# Patient Record
Sex: Female | Born: 1975 | Race: White | Hispanic: No | Marital: Married
Health system: Southern US, Community
[De-identification: ages and names within clinical notes are randomized; demographics above are authoritative.]

---

## 2000-09-10 ENCOUNTER — Encounter: Payer: Self-pay | Admitting: Family Medicine

## 2000-09-10 ENCOUNTER — Ambulatory Visit (HOSPITAL_COMMUNITY): Admission: RE | Admit: 2000-09-10 | Discharge: 2000-09-10 | Payer: Self-pay | Admitting: Family Medicine

## 2000-09-25 ENCOUNTER — Other Ambulatory Visit: Admission: RE | Admit: 2000-09-25 | Discharge: 2000-09-25 | Payer: Self-pay | Admitting: Family Medicine

## 2000-11-13 ENCOUNTER — Ambulatory Visit (HOSPITAL_COMMUNITY): Admission: RE | Admit: 2000-11-13 | Discharge: 2000-11-13 | Payer: Self-pay | Admitting: Family Medicine

## 2000-11-13 ENCOUNTER — Encounter: Payer: Self-pay | Admitting: Family Medicine

## 2001-01-15 ENCOUNTER — Ambulatory Visit (HOSPITAL_COMMUNITY): Admission: RE | Admit: 2001-01-15 | Discharge: 2001-01-15 | Payer: Self-pay | Admitting: Family Medicine

## 2001-01-15 ENCOUNTER — Encounter: Payer: Self-pay | Admitting: Family Medicine

## 2001-02-24 ENCOUNTER — Encounter: Payer: Self-pay | Admitting: Family Medicine

## 2001-02-24 ENCOUNTER — Ambulatory Visit (HOSPITAL_COMMUNITY): Admission: RE | Admit: 2001-02-24 | Discharge: 2001-02-24 | Payer: Self-pay | Admitting: Family Medicine

## 2001-03-21 ENCOUNTER — Ambulatory Visit (HOSPITAL_COMMUNITY): Admission: RE | Admit: 2001-03-21 | Discharge: 2001-03-21 | Payer: Self-pay | Admitting: Family Medicine

## 2001-03-21 ENCOUNTER — Encounter: Payer: Self-pay | Admitting: Family Medicine

## 2001-04-08 ENCOUNTER — Inpatient Hospital Stay (HOSPITAL_COMMUNITY): Admission: AD | Admit: 2001-04-08 | Discharge: 2001-04-10 | Payer: Self-pay | Admitting: Unknown Physician Specialty

## 2003-08-10 ENCOUNTER — Other Ambulatory Visit: Admission: RE | Admit: 2003-08-10 | Discharge: 2003-08-10 | Payer: Self-pay | Admitting: Gynecology

## 2004-12-18 ENCOUNTER — Other Ambulatory Visit: Admission: RE | Admit: 2004-12-18 | Discharge: 2004-12-18 | Payer: Self-pay | Admitting: Gynecology

## 2006-05-28 ENCOUNTER — Other Ambulatory Visit: Admission: RE | Admit: 2006-05-28 | Discharge: 2006-05-28 | Payer: Self-pay | Admitting: Gynecology

## 2006-06-03 ENCOUNTER — Encounter: Admission: RE | Admit: 2006-06-03 | Discharge: 2006-06-03 | Payer: Self-pay | Admitting: Gynecology

## 2007-07-13 ENCOUNTER — Observation Stay (HOSPITAL_COMMUNITY): Admission: EM | Admit: 2007-07-13 | Discharge: 2007-07-15 | Payer: Self-pay | Admitting: Emergency Medicine

## 2007-11-27 ENCOUNTER — Other Ambulatory Visit: Admission: RE | Admit: 2007-11-27 | Discharge: 2007-11-27 | Payer: Self-pay | Admitting: Obstetrics and Gynecology

## 2009-09-29 IMAGING — CR DG CHEST 2V
2 series · 2 of 2 positions shown · non-contrast
Comparison: None.

CLINICAL DATA: 31 year old; fever, cough, and chest pain.
 CHEST - 2 VIEW - 07/13/07:

[w chest pa]
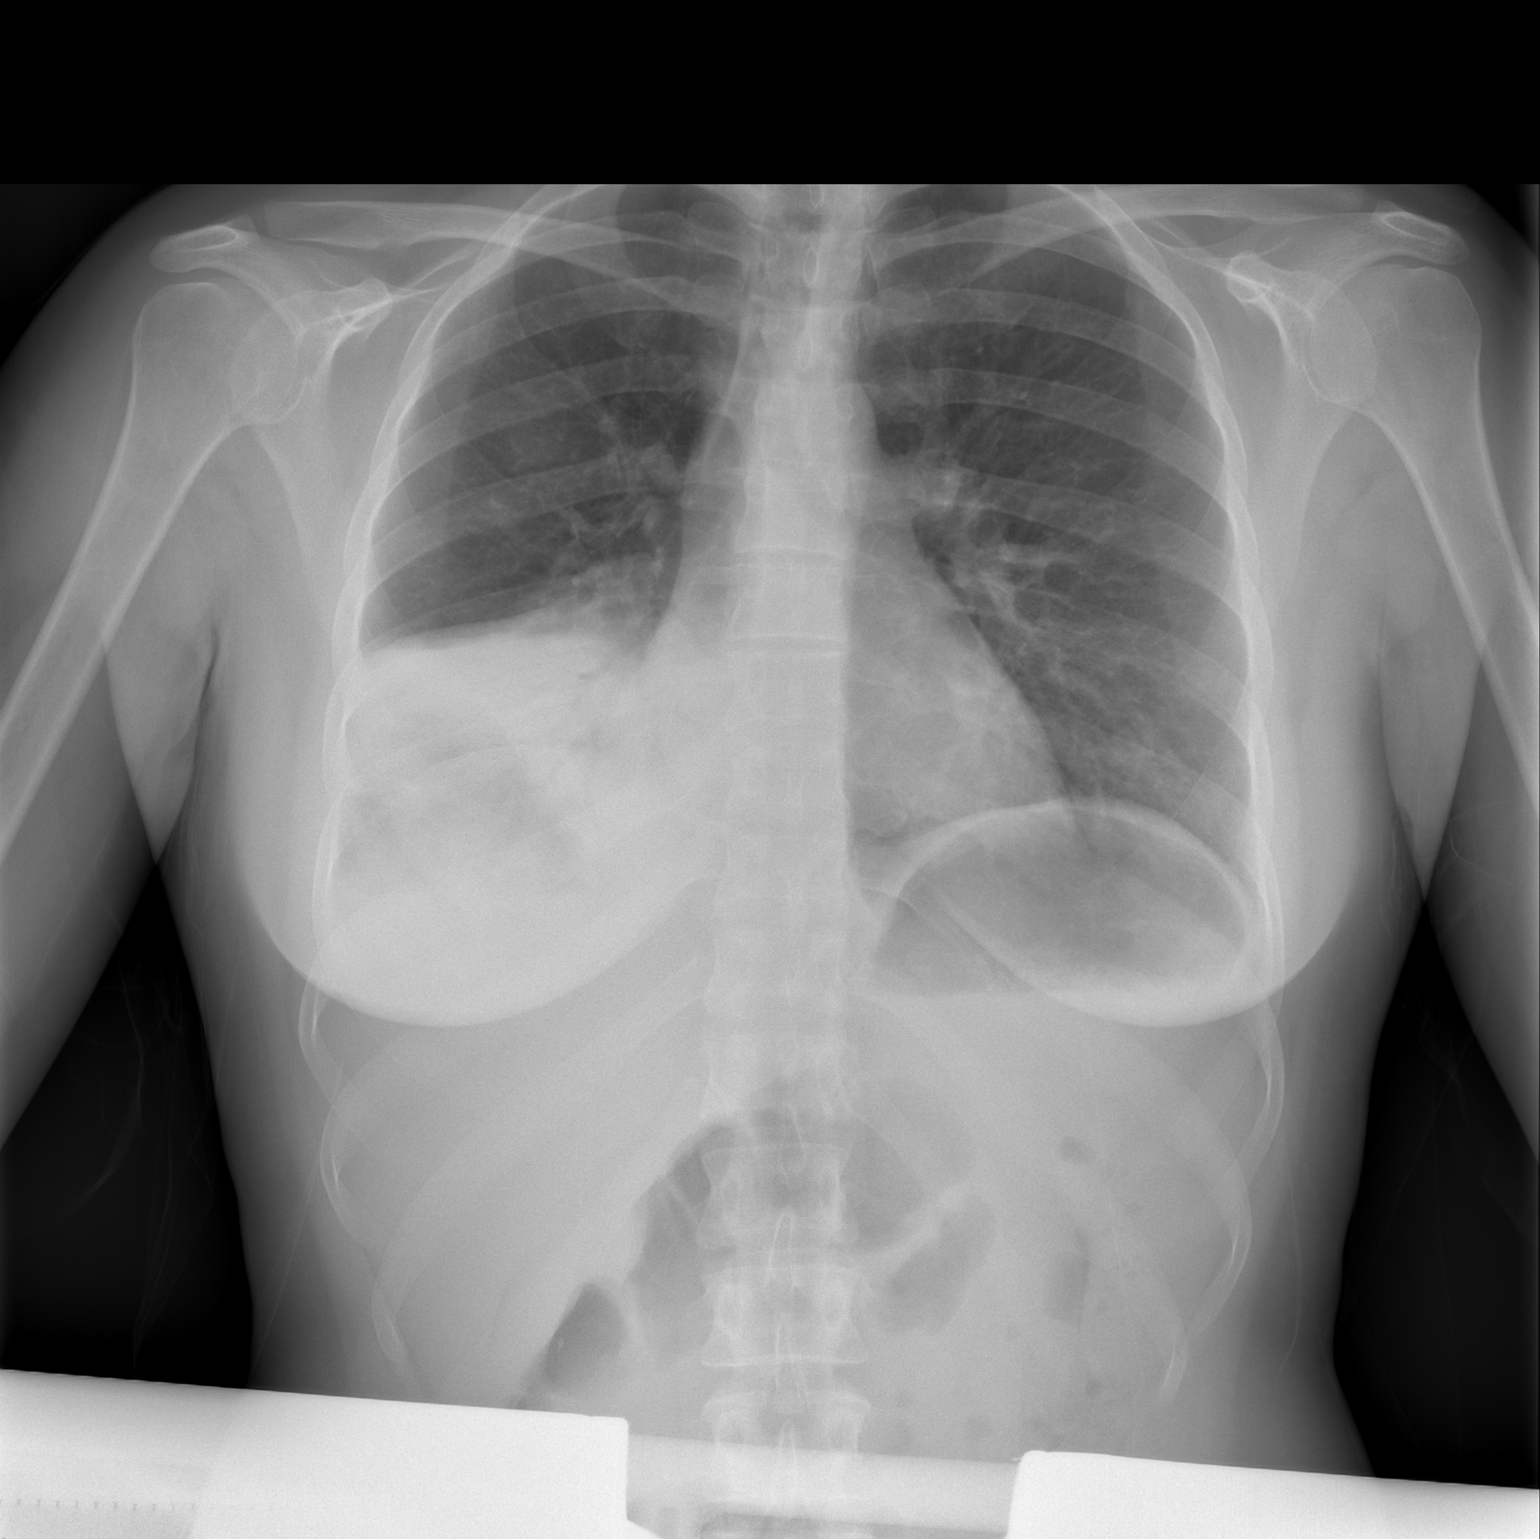

[w chest lat]
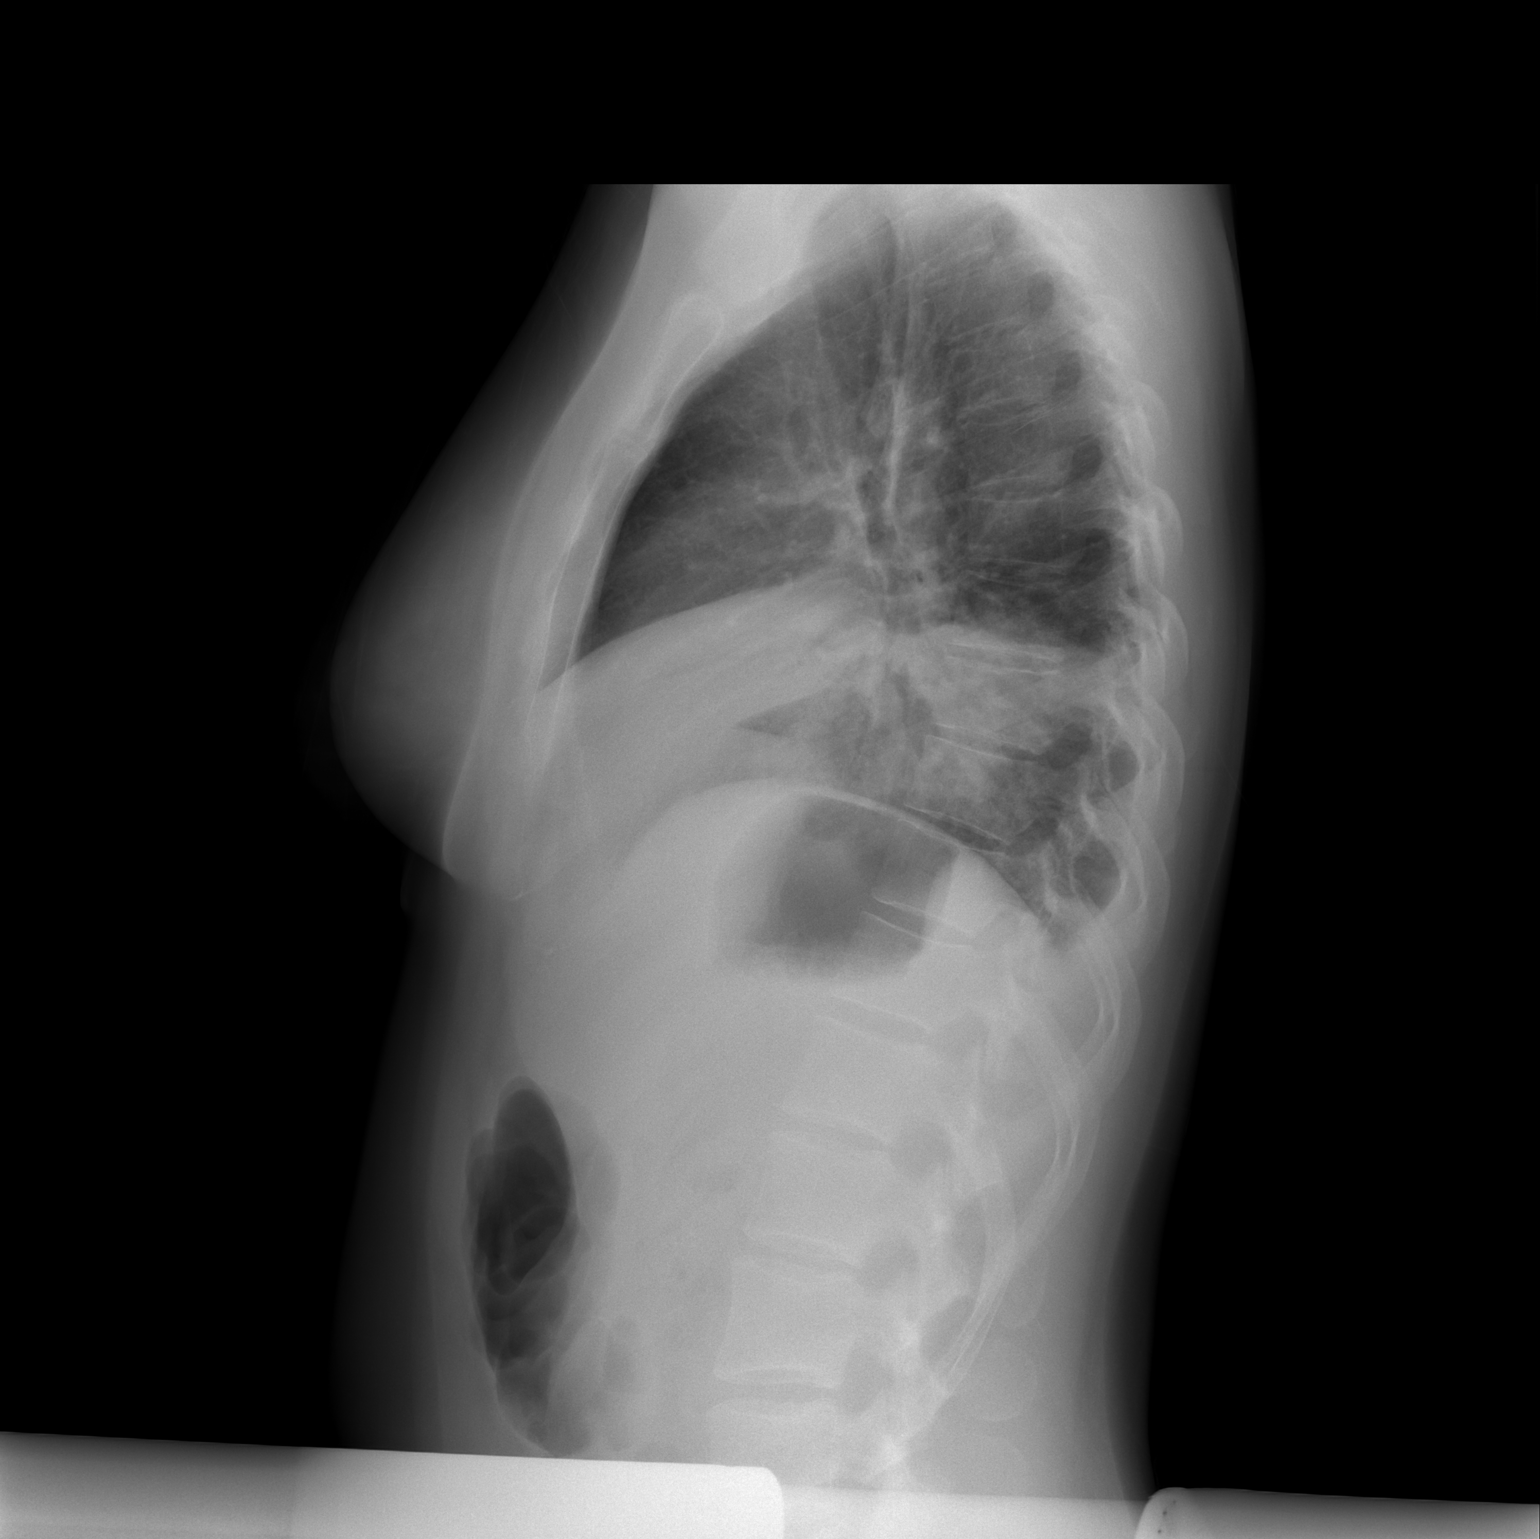

[2 of 2 positions shown; findings below may reference images not displayed]

FINDINGS: The cardiac silhouette, mediastinal and hilar contours are within normal limits.   There are right middle and lower lobe infiltrates. The left lung is clear.  The bony thorax is intact.
IMPRESSION: Right middle lobe and right lower lobe pneumonias.

## 2009-10-01 IMAGING — CR DG CHEST 2V
2 series · 2 of 2 positions shown · non-contrast
Comparison: 07/13/07.

CLINICAL DATA: Pneumonia.  Follow-up exam.
 CHEST - 2 VIEW:

[w chest pa]
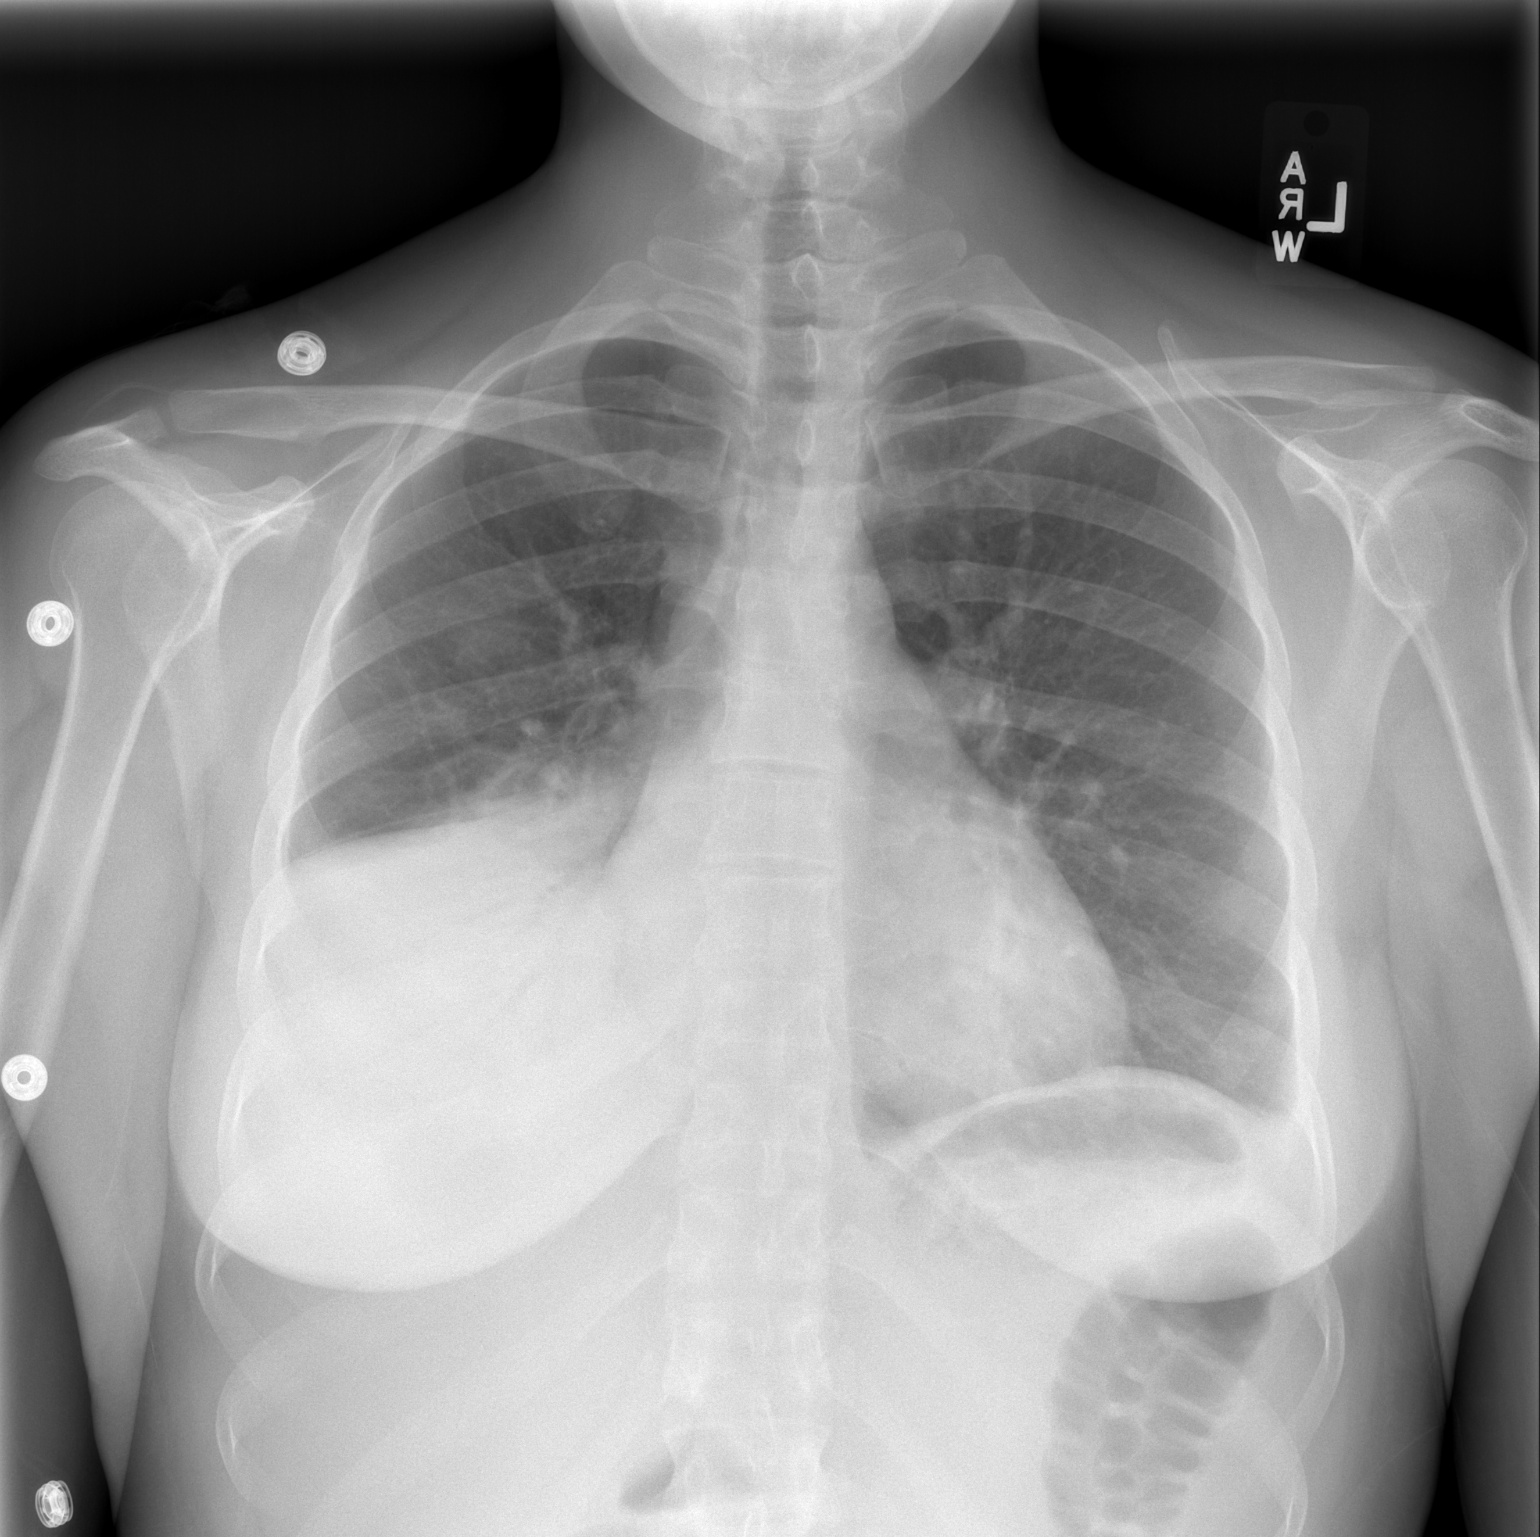

[w chest lat]
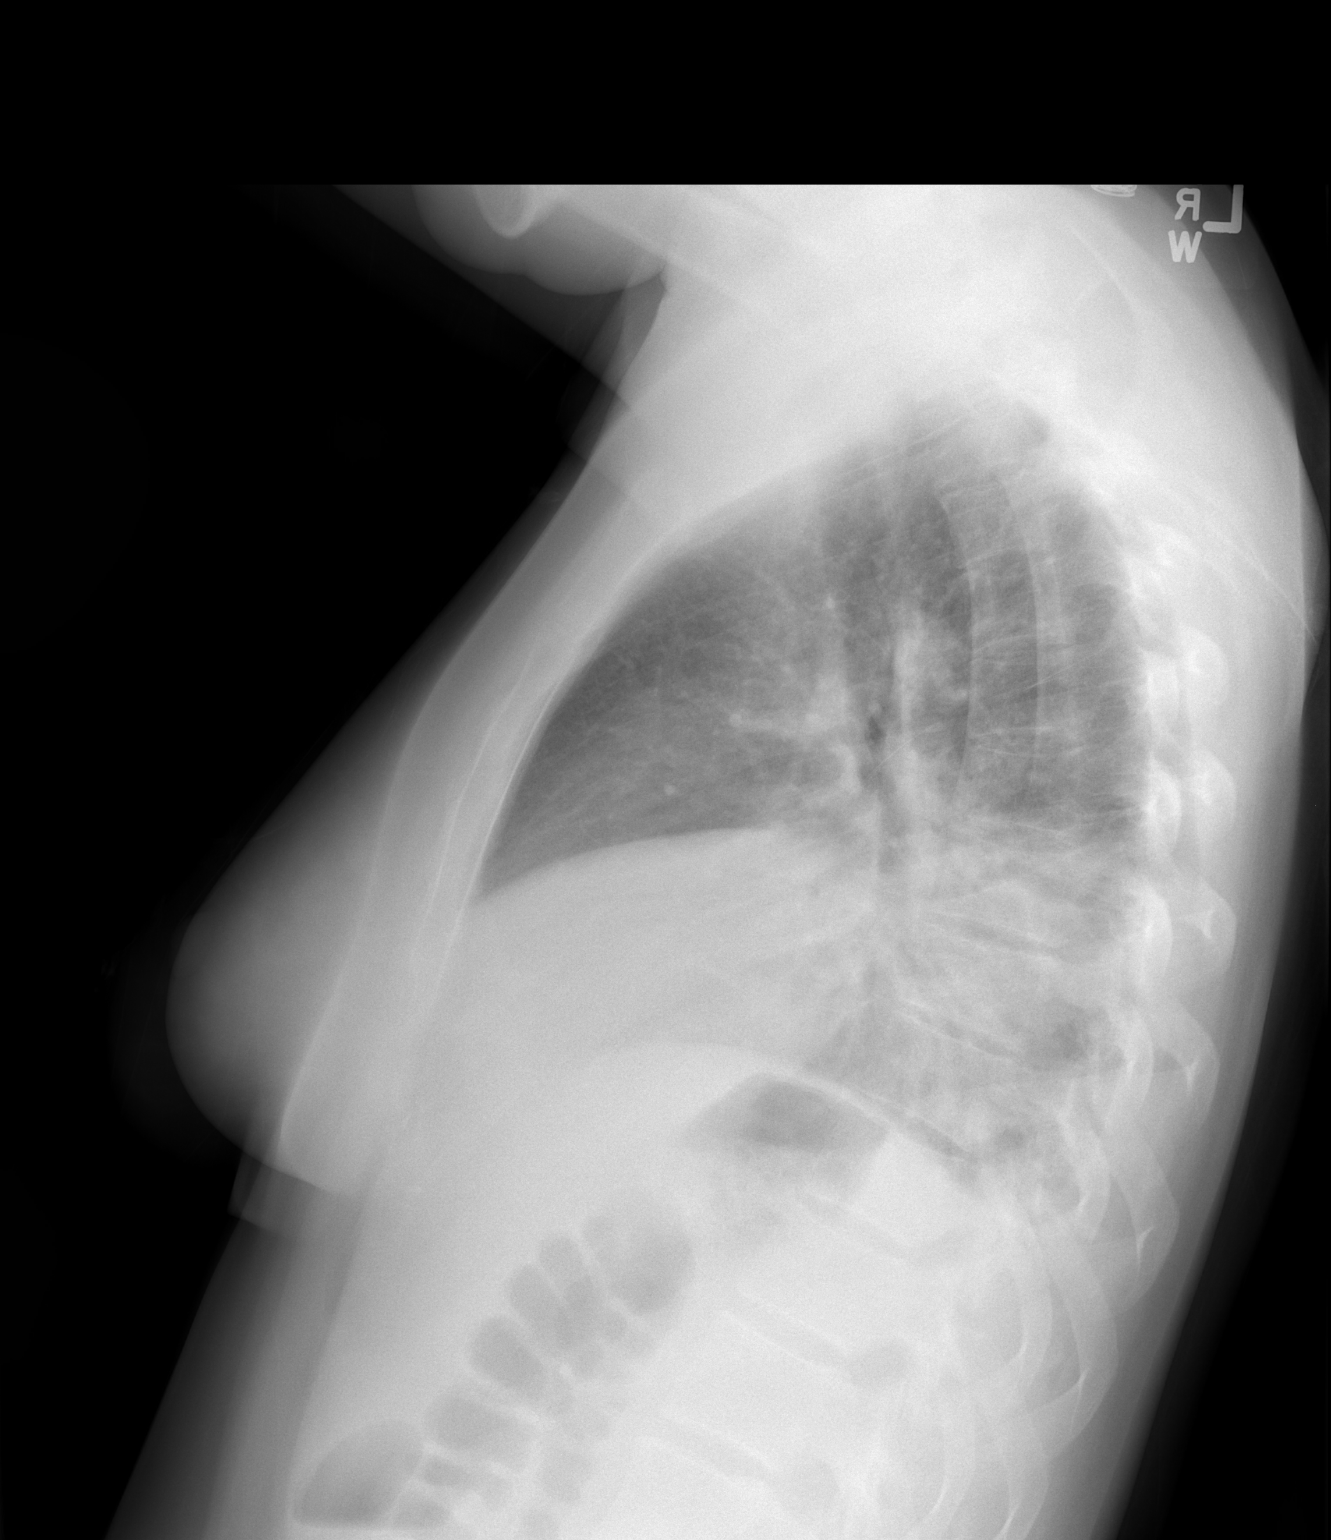

[2 of 2 positions shown; findings below may reference images not displayed]

FINDINGS: There is still extensive pneumonia with consolidation involving the right middle and lower lobes.  Minimal left pleural effusion.
IMPRESSION: Interval increase in degree of pneumonia and consolidation involving the right middle and lower lobes.

## 2010-10-03 NOTE — Discharge Summary (Signed)
Brandi Gaines, Brandi Gaines          ACCOUNT NO.:  [id number]   MEDICAL RECORD NO.:  [id number]          PATIENT TYPE:  INP   LOCATION:  1401                         FACILITY:  [facility]   PHYSICIAN:  Lonia Blood, M.D.       DATE OF BIRTH:  Apr 08, 1976   DATE OF ADMISSION:  07/13/2007  DATE OF DISCHARGE:  07/15/2007                               DISCHARGE SUMMARY   PRIMARY CARE PHYSICIAN:  [facility].   DISCHARGE DIAGNOSES:  1. Right middle lobe and right lower lobe community-acquired      pneumonia.  2. Mild anemia.  3. Bilateral ear pain of unclear etiology.  4. Minor hypokalemia secondary to nebulizers, resolved.   DISCHARGE MEDICATIONS:  1. Cefdinir 300 mg by mouth twice daily for 1 week.  2. Zithromax 500 mg daily for 1 week.  3. Ibuprofen 400 mg every 6 hours as needed for pain.   CONDITION ON DISCHARGE:  [name] is discharged in good condition.  A the time of discharge, the patient is alert, oriented without any  respiratory distress.  She is to be discharged under the care of her  family.  The patient is told to complete one week of oral antibiotics.  The patient is told to report back to the emergency room in case she  develops respiratory failure, worsening chest pain or persistent fever.  The patient is instructed to follow up with her primary care physician  in about 10 days to 2 weeks for a followup chest x-ray to ensure  resolution of the infiltrate.   PROCEDURES DURING THIS ADMISSION:  1. July 13, 2007:  The patient had a portable chest x-ray,      findings of right lower lobe pneumonia.  2. CT scan of the sinuses limited, findings of no significant      sinusitis.  3. Chest x-ray PA and lateral on July 15, 2007 indicating the      right middle lobe, right lower lobe pneumonia and no pleural      effusion.   CONSULTATIONS DURING THIS ADMISSION:  No consultation was obtained.   HISTORY AND PHYSICAL:  Refer to dictation done by  Dr. Flonnie Overman on July 13, 2007.   HOSPITAL COURSE:  1. Community-acquired pneumonia with mild respiratory failure.  Ms.      Schnider was admitted to a telemetry floor.  She had oxygen,      nebulizer treatments and intravenous Rocephin and azithromycin.  By      hospital day number 2, the patient started complaining bitterly of      some sinus pressure and bilateral ear pain.  For that reason, we      attempted to switch her to Avelox to cover for community-acquired      pneumonia and possible sinusitis.  Upon receiving the Avelox, the      patient developed redness and flushing of her face and a rash on      her chest and arms.  The Avelox was immediately discontinued and      the patient received Benadryl, steroids and Pepcid.  She then was  continued on Rocephin and azithromycin.  By July 15, 2007, the      patient reported that she was feeling much better and she requested      to be discharged home.  Followup chest x-ray did not      indicate the presence of a pleural effusion.  As the patient was      afebrile and without any signs of respiratory compromise, we      elected to discharge her home on an oral cephalosporin as well as      oral Zithromax.  The patient was told to follow up with her primary      care physician to ensure resolution of her pneumonia.      Lonia Blood, M.D.  Electronically Signed     SL/MEDQ  D:  07/15/2007  T:  07/15/2007  Job:  [id number]   cc:   [facility]

## 2010-10-03 NOTE — H&P (Signed)
NAMEYILIA, SACCA NO.:  [id number]   MEDICAL RECORD NO.:  [id number]          PATIENT TYPE:  EMS   LOCATION:  ED                           FACILITY:  [facility]   PHYSICIAN:  Lucita Ferrara, MD         DATE OF BIRTH:  1975/11/05   DATE OF ADMISSION:  07/13/2007  DATE OF DISCHARGE:                              HISTORY & PHYSICAL   PRIMARY CARE DOCTOR:  [facility]   CHIEF COMPLAINT:  Cough and shortness of breath.   HISTORY OF PRESENT ILLNESS:  Ms. Crepeau is a 35 year old Caucasian  female who presents to [facility] with chief complaint  of intractable cough, fevers, chills, chest pain that is pleuritic in  nature.  Cough is nonproductive.  Apparently, the patient presented to  her primary care doctor diagnosed with an upper respiratory tract  infection and had influenza AB test done which was negative.  The  patient was also given some outpatient medicines including amantadine,  pseudoephedrine, and codeine without any relief.  The patient denies any  orthopnea or paroxysmal nocturnal dyspnea.  Denies any lower extremity  edema.  Otherwise she is a young, healthy woman .  She denies being  pregnant.  Her last menstrual period was July 11, 2007.  She denies  recent travel or exposure to tuberculosis.  She is a nonsmoker.  She  denies illicit drugs or marijuana.  Currently, she is sexually active  but does not use contraception.   ALLERGIES:  No known drug allergies.   MEDICATIONS AT HOME:  Amantadine 100 mg once daily.  Note that this was  started recently.   SOCIAL HISTORY:  Again, she denies drugs, alcohol or tobacco.   PHYSICAL EXAMINATION:  Ms. Victor is a 35 year old Caucasian female  in acute respiratory distress secondary to pleuritic-type chest pain and  intractable cough.  Her temperature is 103.1, blood pressure is 116/75,  pulse is 119, respirations 18, pulse oximetry is 96% on room air.  HEENT:  Mucous  membranes are dry.  She is normocephalic, atraumatic.  Sclerae are anicteric.  NECK:  Supple.  No JVD, no carotid bruits.  PERRLA.  Extraocular muscles  intact.  CARDIOVASCULAR:  S1, S2.  Regular rate and rhythm.  No murmurs, rubs,  clicks.  ABDOMEN:  Soft, nontender, nondistended, positive bowel sounds.  LUNGS:  There are decreased breath sounds in the right middle lobe and  lower lobe.  Otherwise, there are scattered wheezes bilaterally.  NEUROLOGIC:  The patient is anxious but alert and oriented x3.  Cranial  nerves II-XII grossly intact.   CBC:  White count 11.9, hemoglobin 13.4, hematocrit 38.9, platelet count  204.  Complete metabolic panel is all within normal limits.   In the ED she was given normal saline bolus and started on Rocephin 1 g  IV piggyback and Zithromax 500 mg IV piggyback.   Chest x-ray shows multilobar pneumonia, right middle lobe and right  lower lobe.   ASSESSMENT AND PLAN:  A 35 year old with:   Cough, pleuritic-type chest pain, fevers and chills secondary to  multilobar pneumonia confirmed on chest  x-ray.  We will go ahead and  admit the patient to telemetry unit.  Will get STAT ABGs, supplemental  O2, start nebulizer treatments with albuterol and Atrovent per  respiratory therapy, start Rocephin and Zithromax for coverage for  community-acquired pneumonia, sputum cultures and urine for pneumococcal  antigen, blood cultures x2.  Currently, the patient is hemodynamically  stable.  The patient condition are guarded.  Will go ahead and be  aggressive in her treatment.  If needed, the patient will need to be  transferred to the ICU.  I have explained the plans and procedures of  this admission to the patient and the patient's parents and they  understand.      Lucita Ferrara, MD  Electronically Signed     RR/MEDQ  D:  07/13/2007  T:  07/13/2007  Job:  [id number]

## 2011-02-12 LAB — BASIC METABOLIC PANEL
CO2: 26
Calcium: 8.1 — ABNORMAL LOW
Calcium: 9.5
Chloride: 99
Creatinine, Ser: 0.66
GFR calc Af Amer: >60
GFR calc non Af Amer: >60
Glucose, Bld: 123 — ABNORMAL HIGH
Sodium: 135
Sodium: 139

## 2011-02-12 LAB — COMPREHENSIVE METABOLIC PANEL
AST: 18
BUN: 6
CO2: 25
Calcium: 8.2 — ABNORMAL LOW
Chloride: 109
Creatinine, Ser: 0.75
GFR calc Af Amer: >60
GFR calc non Af Amer: >60
Total Bilirubin: 0.9

## 2011-02-12 LAB — MAGNESIUM: Magnesium: 1.9

## 2011-02-12 LAB — CBC
HCT: 31.1 — ABNORMAL LOW
HCT: 38.9
Hemoglobin: 13.4
Hemoglobin: 9.9 — ABNORMAL LOW
MCHC: 34.4
MCHC: 34.4
MCHC: 34.5
MCV: 88.3
MCV: 89
RBC: 3.27 — ABNORMAL LOW
RBC: 3.5 — ABNORMAL LOW
RDW: 14.2
WBC: 6.4
WBC: 9.1

## 2011-02-12 LAB — CULTURE, BLOOD (ROUTINE X 2): Culture: NO GROWTH

## 2011-02-12 LAB — DIFFERENTIAL
Basophils Absolute: 0
Eosinophils Absolute: 0
Lymphocytes Relative: 5 — ABNORMAL LOW
Lymphs Abs: 0.6 — ABNORMAL LOW
Monocytes Absolute: 0.1
Neutro Abs: 11.1 — ABNORMAL HIGH

## 2011-02-12 LAB — URINALYSIS, ROUTINE W REFLEX MICROSCOPIC
Bilirubin Urine: NEGATIVE
Glucose, UA: NEGATIVE
Protein, ur: 100 — AB
Urobilinogen, UA: 2 — ABNORMAL HIGH

## 2011-02-12 LAB — URINE CULTURE: Colony Count: NO GROWTH

## 2011-02-12 LAB — RAPID URINE DRUG SCREEN, HOSP PERFORMED
Amphetamines: NOT DETECTED
Barbiturates: NOT DETECTED

## 2011-02-12 LAB — PHOSPHORUS: Phosphorus: 2.4

## 2018-09-19 ENCOUNTER — Other Ambulatory Visit (HOSPITAL_COMMUNITY): Payer: Self-pay | Admitting: Family Medicine

## 2018-09-19 ENCOUNTER — Other Ambulatory Visit: Payer: Self-pay | Admitting: Family Medicine

## 2018-09-19 DIAGNOSIS — I739 Peripheral vascular disease, unspecified: Secondary | ICD-10-CM

## 2018-09-23 ENCOUNTER — Other Ambulatory Visit: Payer: Self-pay

## 2018-09-23 ENCOUNTER — Ambulatory Visit (HOSPITAL_COMMUNITY)
Admission: RE | Admit: 2018-09-23 | Discharge: 2018-09-23 | Disposition: A | Discharge status: Home / Self Care | Payer: BC Managed Care – PPO | Source: Ambulatory Visit | Attending: Family Medicine | Admitting: Family Medicine

## 2018-09-23 DIAGNOSIS — I739 Peripheral vascular disease, unspecified: Secondary | ICD-10-CM

## 2019-03-17 ENCOUNTER — Other Ambulatory Visit: Payer: Self-pay | Admitting: *Deleted

## 2019-03-17 DIAGNOSIS — Z20822 Contact with and (suspected) exposure to covid-19: Secondary | ICD-10-CM

## 2019-03-18 LAB — NOVEL CORONAVIRUS, NAA: SARS-CoV-2, NAA: NOT DETECTED

## 2019-05-31 ENCOUNTER — Telehealth: Payer: BC Managed Care – PPO

## 2021-11-06 ENCOUNTER — Ambulatory Visit (INDEPENDENT_AMBULATORY_CARE_PROVIDER_SITE_OTHER): Payer: BC Managed Care – PPO

## 2021-11-06 ENCOUNTER — Encounter: Payer: Self-pay | Admitting: Podiatry

## 2021-11-06 ENCOUNTER — Ambulatory Visit: Payer: BC Managed Care – PPO | Admitting: Podiatry

## 2021-11-06 DIAGNOSIS — D169 Benign neoplasm of bone and articular cartilage, unspecified: Secondary | ICD-10-CM

## 2021-11-06 DIAGNOSIS — M79675 Pain in left toe(s): Secondary | ICD-10-CM | POA: Diagnosis not present

## 2021-11-06 DIAGNOSIS — M2042 Other hammer toe(s) (acquired), left foot: Secondary | ICD-10-CM | POA: Diagnosis not present

## 2021-11-06 DIAGNOSIS — M21619 Bunion of unspecified foot: Secondary | ICD-10-CM | POA: Diagnosis not present

## 2021-11-06 NOTE — Progress Notes (Signed)
Subjective:   Patient ID: Brandi Gaines, female   DOB: 46 y.o.   MRN: [id number]   HPI Patient presents stating she has a toe that is really sore on the left foot and has bunion deformity left over right that is painful and she is tried wider shoes soaks and other modalities without relief of symptoms.  States his toe has been bothering her more over the last few months and she has tried to trim it her past and knows the toe is rotated   Review of Systems  All other systems reviewed and are negative.       Objective:  Physical Exam Vitals and nursing note reviewed.  Constitutional:      Appearance: She is well-developed.  Pulmonary:     Effort: Pulmonary effort is normal.  Musculoskeletal:        General: Normal range of motion.  Skin:    General: Skin is warm.  Neurological:     Mental Status: She is alert.     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  Explained procedure risk patient would like to get this done before holding off currently and today I debrided the lesion and applied cushion we will see the results of this.  X-rays indicate significant structural bunion deformity left over right significant rotation digit 5 bilateral with distal lateral exostosis digit 5 left
